# Patient Record
Sex: Female | Born: 1965 | Race: White | Hispanic: No | Marital: Single | Smoking: Never smoker
Health system: Southern US, Community
[De-identification: ages and names within clinical notes are randomized; demographics above are authoritative.]

## PROBLEM LIST (undated history)

## (undated) DIAGNOSIS — I10 Essential (primary) hypertension: Secondary | ICD-10-CM

## (undated) DIAGNOSIS — M1990 Unspecified osteoarthritis, unspecified site: Secondary | ICD-10-CM

## (undated) DIAGNOSIS — F419 Anxiety disorder, unspecified: Secondary | ICD-10-CM

---

## 2018-01-25 ENCOUNTER — Ambulatory Visit (INDEPENDENT_AMBULATORY_CARE_PROVIDER_SITE_OTHER): Payer: BC Managed Care – PPO

## 2018-01-25 ENCOUNTER — Ambulatory Visit
Admission: EM | Admit: 2018-01-25 | Discharge: 2018-01-25 | Disposition: A | Discharge status: Home / Self Care | Payer: BC Managed Care – PPO | Attending: Emergency Medicine | Admitting: Emergency Medicine

## 2018-01-25 ENCOUNTER — Encounter: Payer: Self-pay | Admitting: *Deleted

## 2018-01-25 DIAGNOSIS — M25561 Pain in right knee: Secondary | ICD-10-CM

## 2018-01-25 HISTORY — DX: Anxiety disorder, unspecified: F41.9

## 2018-01-25 HISTORY — DX: Unspecified osteoarthritis, unspecified site: M19.90

## 2018-01-25 HISTORY — DX: Essential (primary) hypertension: I10

## 2018-01-25 MED ORDER — MELOXICAM 15 MG PO TABS: 15.0000 mg | ORAL_TABLET | Freq: Every day | ORAL | 0 refills | Status: AC

## 2018-01-25 NOTE — ED Provider Notes (Signed)
[facility]    CSN: [id number] Arrival date & time: 01/25/18  1324     History   Chief Complaint Chief Complaint  Patient presents with  . Knee Pain    HPI Morgan Grimes is a 52 y.o. female.   Morgan Grimes is a 52 y.o. female who presents for 1 week history of right knee pain, without traumatic cause. States pain started after recent road trip, pain is worse with bearing weight, and extension.it is relieved with wearing of a knee sleeve, and over-the-counter ibuprofen. Currently rates pain 3 out of 10.  The history is provided by the patient.  Knee Pain  Location:  Knee Time since incident:  1 week Injury: no   Knee location:  R knee Pain details:    Quality:  Aching and tearing   Radiates to:  Does not radiate   Severity:  Mild   Onset quality:  Gradual   Duration:  1 week   Timing:  Constant   Progression:  Worsening Chronicity:  New Dislocation: no   Prior injury to area:  No Relieved by:  Compression, immobilization, NSAIDs and rest Worsened by:  Bearing weight, extension and rotation Associated symptoms: decreased ROM   Associated symptoms: no fatigue, no fever, no muscle weakness, no numbness, no swelling and no tingling   Risk factors: no frequent fractures and no known bone disorder     Past Medical History:  Diagnosis Date  . Anxiety   . Arthritis   . Hypertension     There are no active problems to display for this patient.   History reviewed. No pertinent surgical history.  OB History   None      Home Medications    Prior to Admission medications   Medication Sig Start Date End Date Taking? Authorizing Provider  losartan (COZAAR) 25 MG tablet Take 25 mg by mouth daily.   Yes [provider]  venlafaxine (EFFEXOR) 25 MG tablet Take 25 mg by mouth 2 (two) times daily.   Yes [provider]  meloxicam (MOBIC) 15 MG tablet Take 1 tablet (15 mg total) by mouth daily. 01/25/18   Dorena Bodo, NP    Family  History History reviewed. No pertinent family history.  Social History Social History   Tobacco Use  . Smoking status: Never Smoker  . Smokeless tobacco: Never Used  Substance Use Topics  . Alcohol use: Yes    Comment: at times  . Drug use: Never     Allergies   Patient has no known allergies.   Review of Systems Review of Systems  Constitutional: Negative for chills, fatigue and fever.  Gastrointestinal: Negative for nausea and vomiting.  Musculoskeletal: Positive for joint swelling.  Skin: Negative for color change and pallor.  Neurological: Negative for dizziness and numbness.     Physical Exam Triage Vital Signs ED Triage Vitals  Enc Vitals Group     BP 01/25/18 0959 140/90     Pulse Rate 01/25/18 0959 76     Resp 01/25/18 0959 16     Temp 01/25/18 0959 97.7 F (36.5 C)     Temp Source 01/25/18 0959 Oral     SpO2 01/25/18 0959 100 %     Weight 01/25/18 0956 150 lb (68 kg)     Height 01/25/18 0956  (1.575 m)     Head Circumference --      Peak Flow --      Pain Score 01/25/18 0956 3  Pain Loc --      Pain Edu? --      Excl. in GC? --    No data found.  Updated Vital Signs BP 140/90 (BP Location: Left Arm)   Pulse 76   Temp 97.7 F (36.5 C) (Oral)   Resp 16   Ht  (1.575 m)   Wt 150 lb (68 kg)   SpO2 100%   BMI 27.44 kg/m    Physical Exam  Constitutional: She appears well-developed and well-nourished. No distress.  Cardiovascular: Intact distal pulses.  Musculoskeletal:       Right knee: She exhibits decreased range of motion and swelling. She exhibits no effusion, no ecchymosis, no deformity, no LCL laxity, normal patellar mobility, no bony tenderness and no MCL laxity. No medial joint line, no lateral joint line, no MCL, no LCL and no patellar tendon tenderness noted.       Left knee: Normal.  Neurological: She is alert. Gait ( antalgic gait ) abnormal.  Skin: Skin is warm and dry. Capillary refill takes less than 2 seconds.    Nursing note and vitals reviewed.    UC Treatments / Results  Labs (all labs ordered are listed, but only abnormal results are displayed) Labs Reviewed - No data to display  EKG None Radiology Dg Knee Complete 4 Views Right  Result Date: 01/25/2018 CLINICAL DATA:  Right knee twisting injury 1 week ago, anterolateral pain, initial encounter. EXAM: RIGHT KNEE - COMPLETE 4+ VIEW COMPARISON:  None. FINDINGS: There is an irregular osteochondral defect along the articular surface of the lateral femoral condyle. No joint effusion. Trace patellofemoral and medial compartment osteophytosis. IMPRESSION: 1. Osteochondral defect along the articular surface of the lateral femoral condyle, possibly posttraumatic in etiology given recent injury. 2. Trace osteophytosis in the patellofemoral and lateral compartments. Electronically Signed   By: Leanna Battles M.D.   On: 01/25/2018 11:12    Procedures Procedures (including critical care time)  Medications Ordered in UC Medications - No data to display   Initial Impression / Assessment and Plan / UC Course  I have reviewed the triage vital signs and the nursing notes.  Pertinent labs & imaging results that were available during my care of the patient were reviewed by me and considered in my medical decision making (see chart for details).     No acute fracture or dislocation, x-ray shows osteochondral defect corresponding to area of pain. Will stop ibuprofen and switch to mobic daily, along with tylenol PRN, recommend follow up with orthopedics for further evaluation.  Final Clinical Impressions(s) / UC Diagnoses   Final diagnoses:  Acute pain of right knee    ED Discharge Orders        Ordered    meloxicam (MOBIC) 15 MG tablet  Daily     01/25/18 1143       Controlled Substance Prescriptions [place] Controlled Substance Registry consulted? Not Applicable   Dorena Bodo, NP 01/25/18 1204

## 2018-01-25 NOTE — ED Triage Notes (Signed)
Pt states she twisted her right knee last week and she is still having pain 3/10 rated.

## 2018-01-25 NOTE — Discharge Instructions (Signed)
Stop your ibuprofen, start the mobic once daily. You may take OTC Tylenol as needed, otherwise follow up with orthopedics for further evaluation.

## 2018-10-09 IMAGING — CR DG KNEE COMPLETE 4+V*R*
4 series · 4 of 4 positions shown · non-contrast
Comparison: None.

CLINICAL DATA: Right knee twisting injury 1 week ago, anterolateral
pain, initial encounter.

EXAM:
RIGHT KNEE - COMPLETE 4+ VIEW

[knee ap]
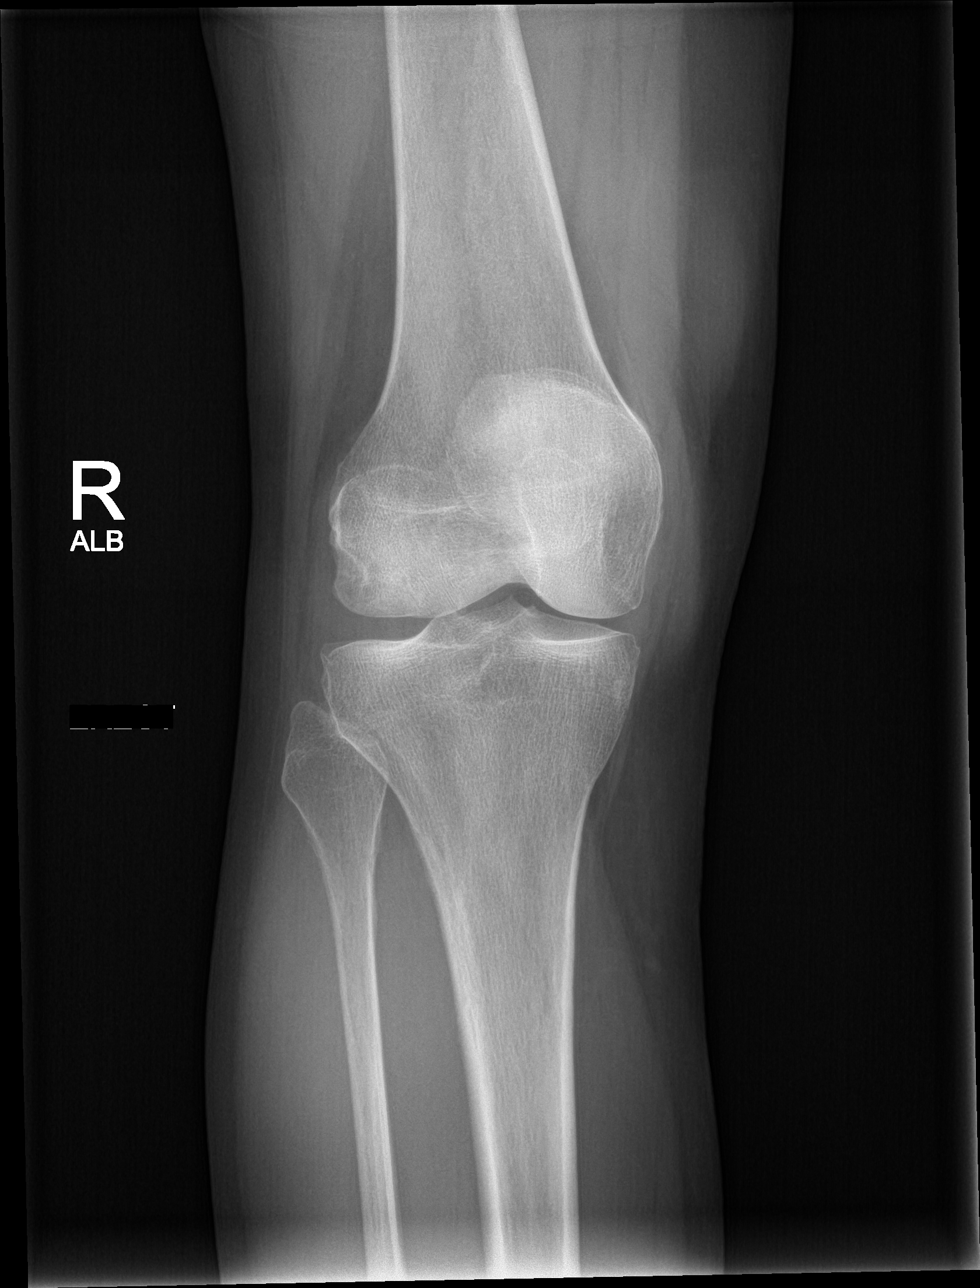

[knee lat]
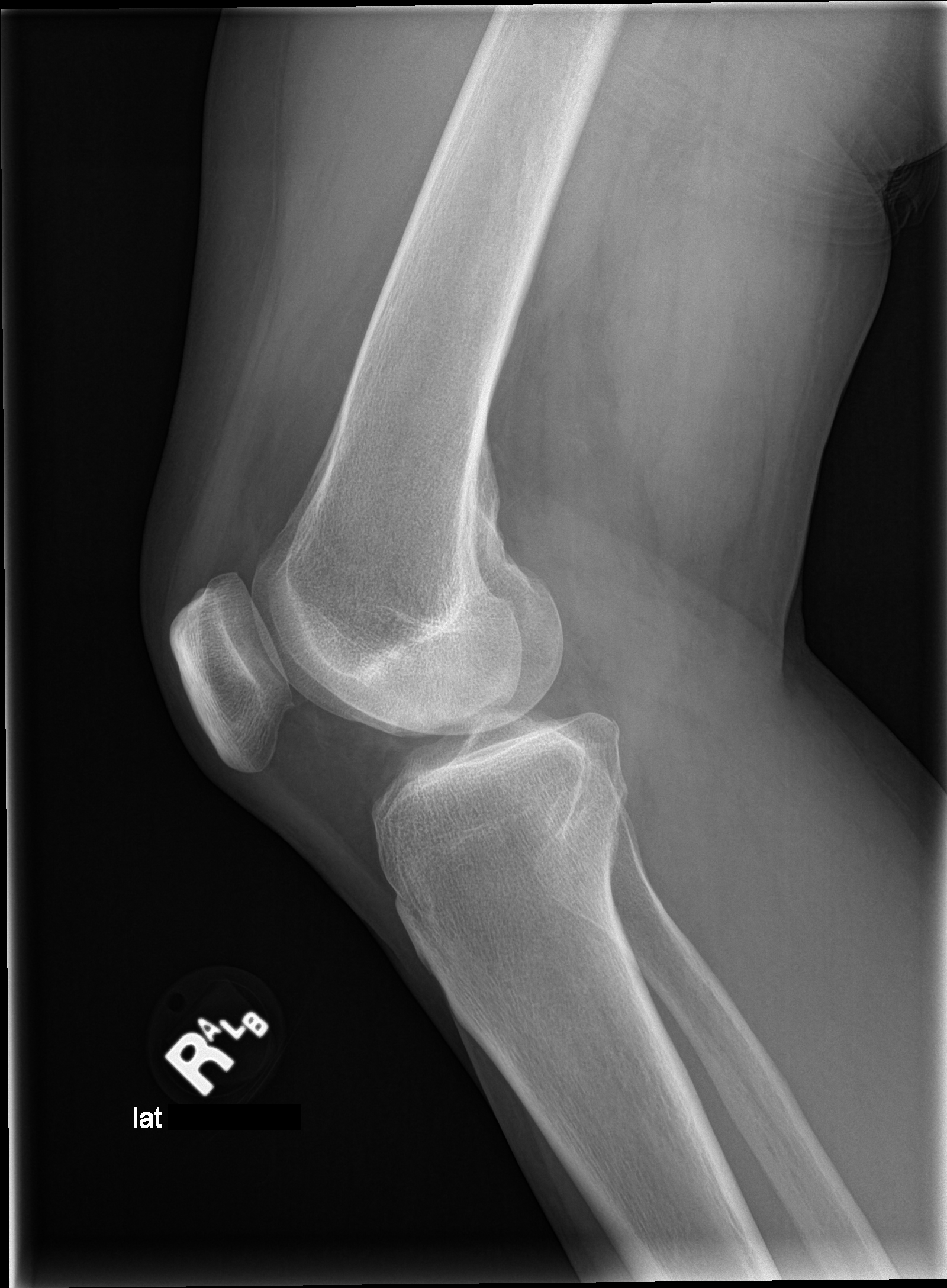

[tunnel]
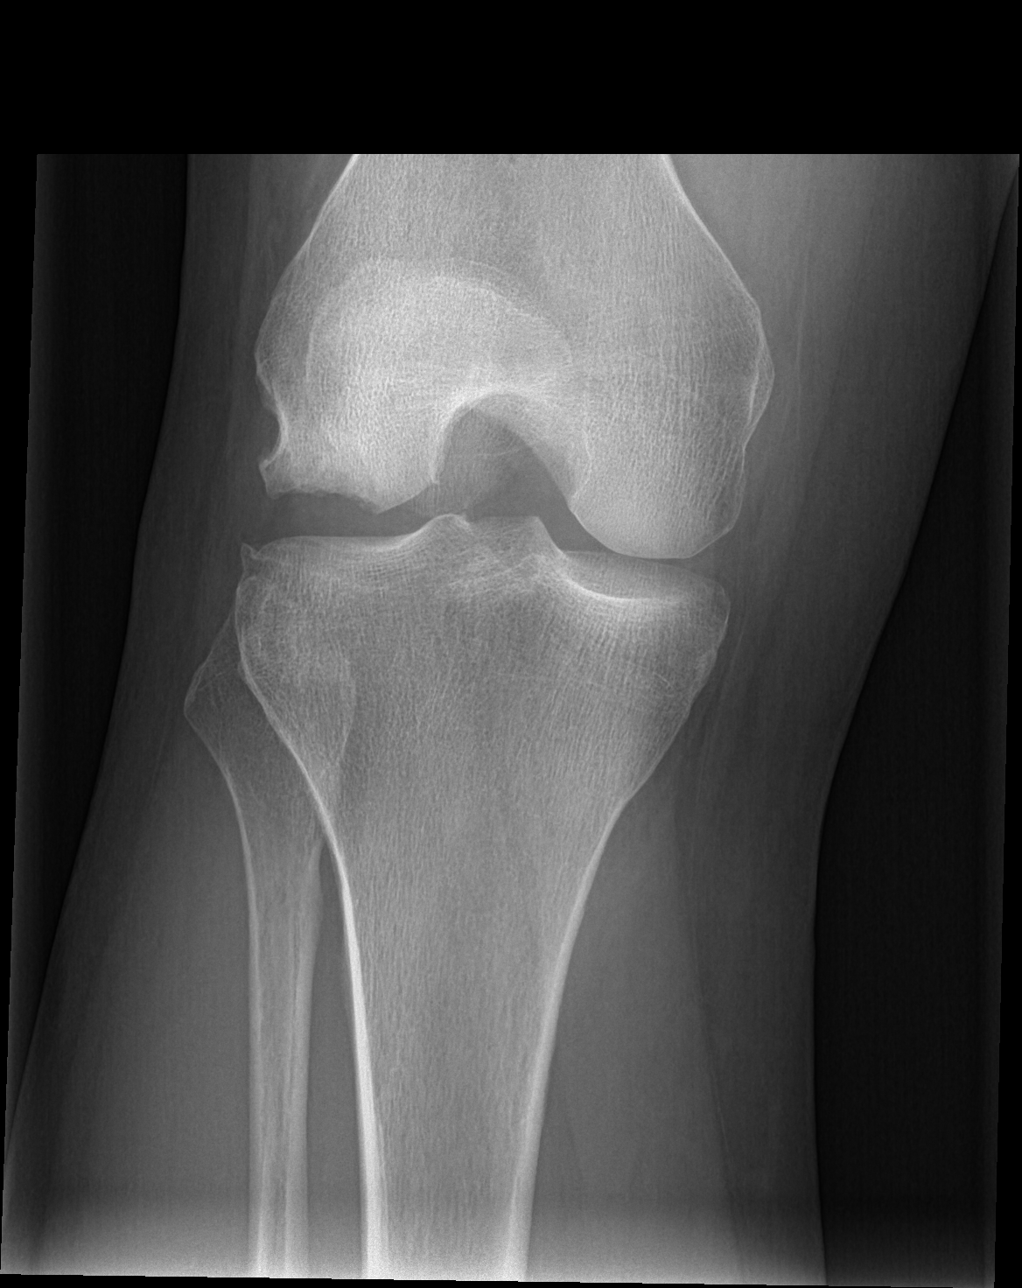

[patella skyline]
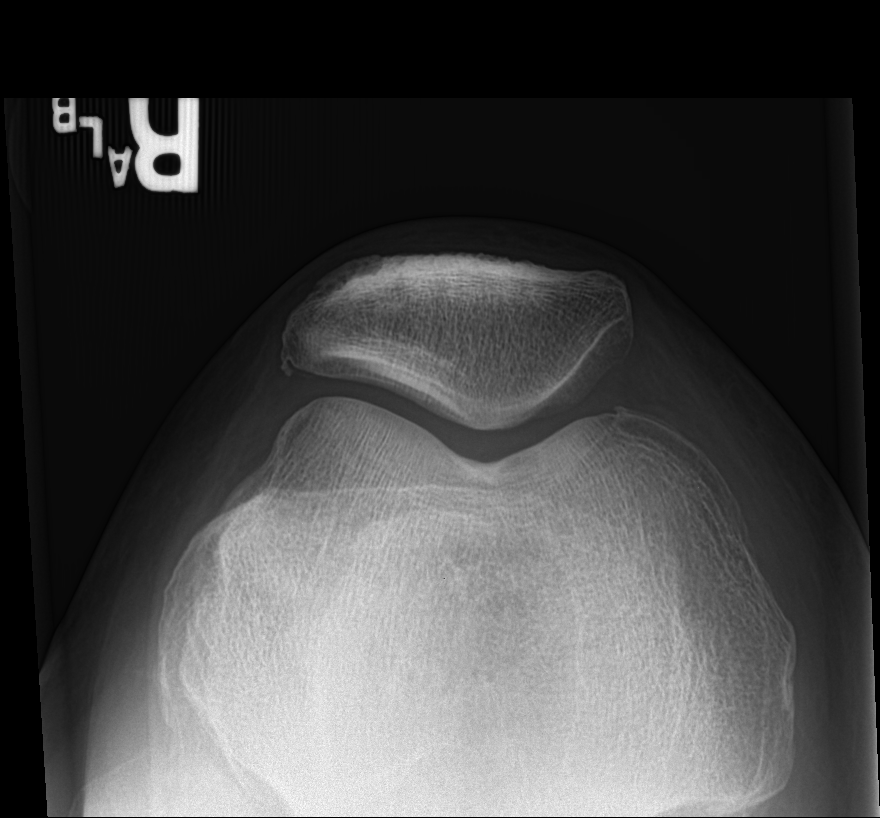

[4 of 4 positions shown; findings below may reference images not displayed]

FINDINGS: There is an irregular osteochondral defect along the articular
surface of the lateral femoral condyle. No joint effusion. Trace
patellofemoral and medial compartment osteophytosis.
IMPRESSION: 1. Osteochondral defect along the articular surface of the lateral
femoral condyle, possibly posttraumatic in etiology given recent
injury.
2. Trace osteophytosis in the patellofemoral and lateral
compartments.
